# Patient Record
Sex: Male | Born: 1980 | Race: White | Hispanic: No | Marital: Married | Smoking: Never smoker
Health system: Southern US, Community
[De-identification: ages and names within clinical notes are randomized; demographics above are authoritative.]

---

## 2016-01-30 ENCOUNTER — Other Ambulatory Visit: Payer: Self-pay | Admitting: Internal Medicine

## 2016-01-30 DIAGNOSIS — R945 Abnormal results of liver function studies: Secondary | ICD-10-CM

## 2016-02-08 ENCOUNTER — Ambulatory Visit
Admission: RE | Admit: 2016-02-08 | Discharge: 2016-02-08 | Disposition: A | Discharge status: Home / Self Care | Payer: Managed Care, Other (non HMO) | Source: Ambulatory Visit | Attending: Internal Medicine | Admitting: Internal Medicine

## 2016-02-08 ENCOUNTER — Encounter (INDEPENDENT_AMBULATORY_CARE_PROVIDER_SITE_OTHER): Payer: Self-pay

## 2016-02-08 DIAGNOSIS — R945 Abnormal results of liver function studies: Secondary | ICD-10-CM

## 2017-02-18 ENCOUNTER — Other Ambulatory Visit: Payer: Self-pay | Admitting: Internal Medicine

## 2017-02-18 DIAGNOSIS — R7989 Other specified abnormal findings of blood chemistry: Secondary | ICD-10-CM

## 2017-02-18 DIAGNOSIS — R945 Abnormal results of liver function studies: Principal | ICD-10-CM

## 2017-02-20 ENCOUNTER — Other Ambulatory Visit: Payer: Managed Care, Other (non HMO)

## 2017-02-21 ENCOUNTER — Other Ambulatory Visit: Payer: Managed Care, Other (non HMO)

## 2017-11-25 IMAGING — US US ABDOMEN COMPLETE
1 series · 13 of 25 positions shown · non-contrast
Comparison: None in PACs

CLINICAL DATA: Abnormal liver function studies

EXAM:
ABDOMEN ULTRASOUND COMPLETE

[Series 1: us abdomen complete · 0.32mm/px · 13 of 98 slices shown]
[im 1/98]
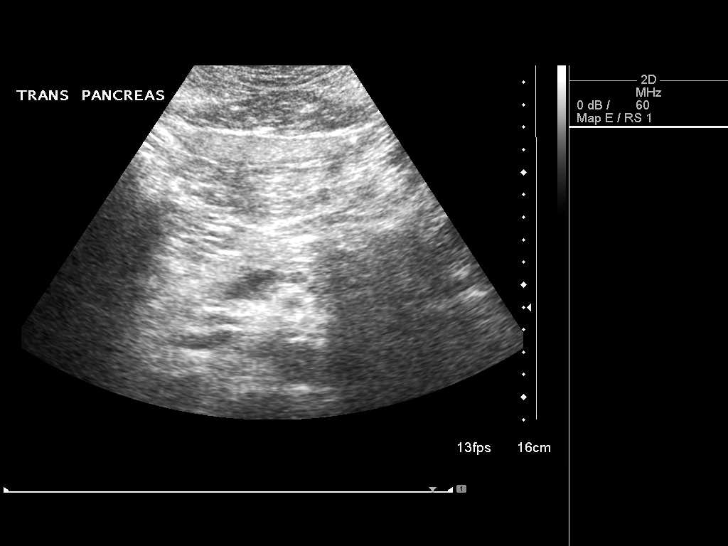
[im 9/98]
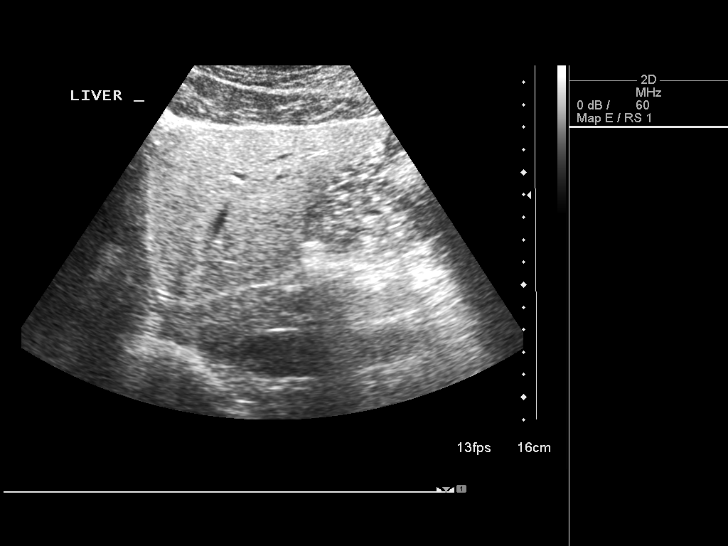
[im 17/98]
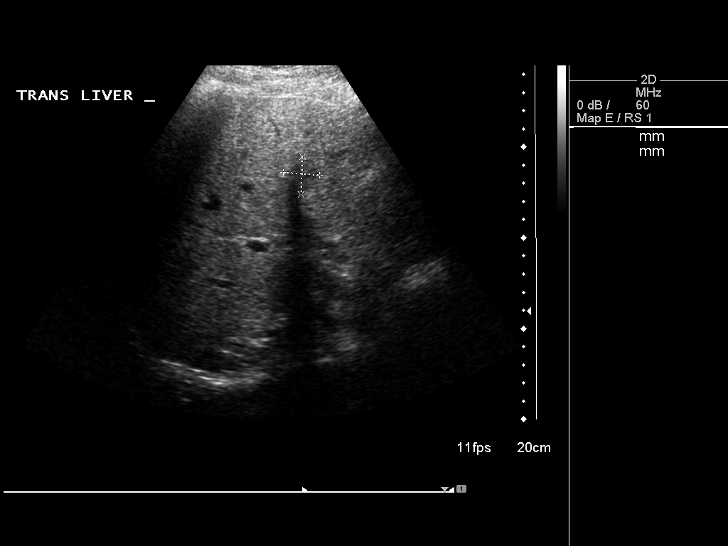
[im 25/98]
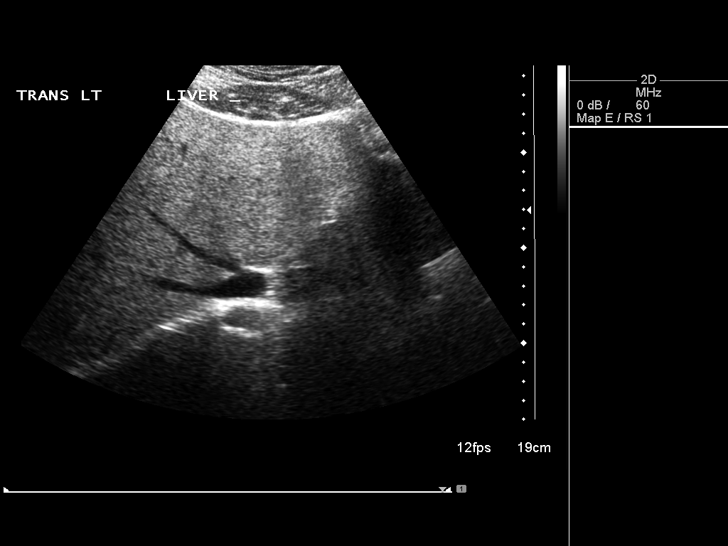
[im 33/98]
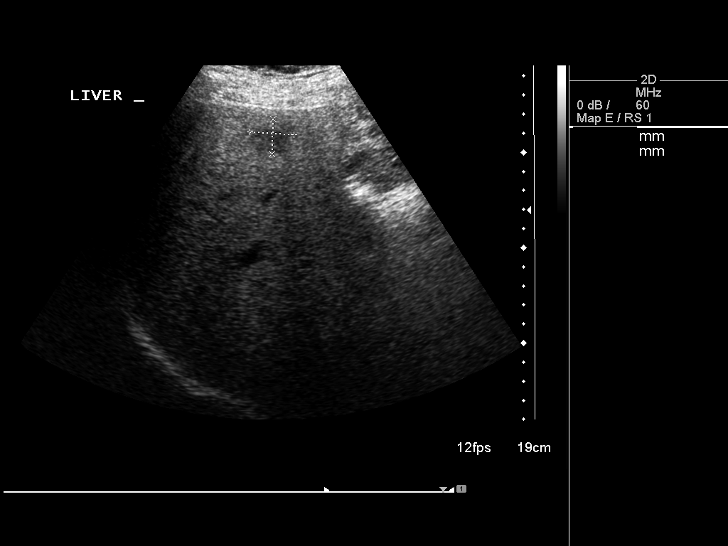
[im 41/98]
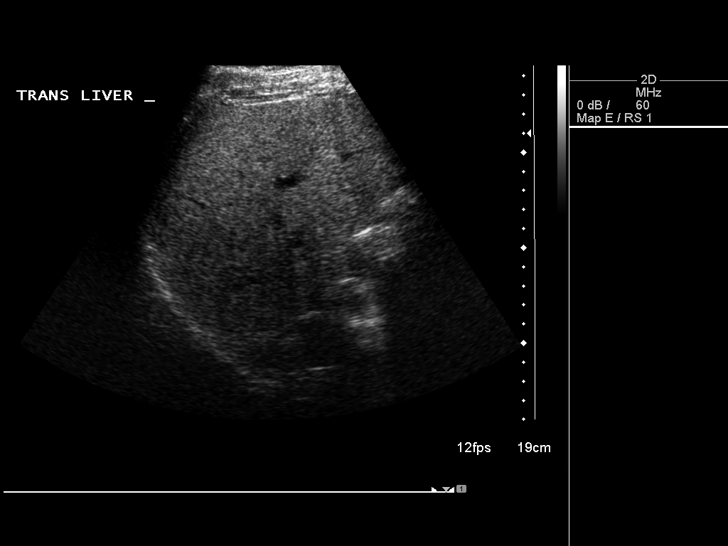
[im 49/98]
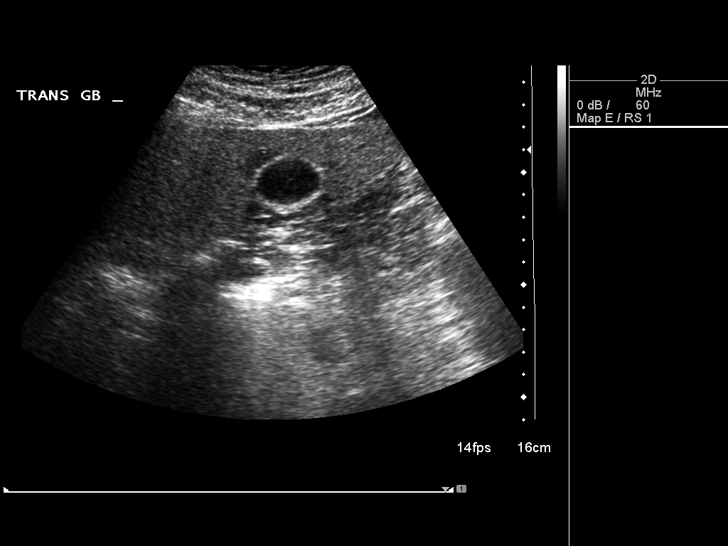
[im 57/98]
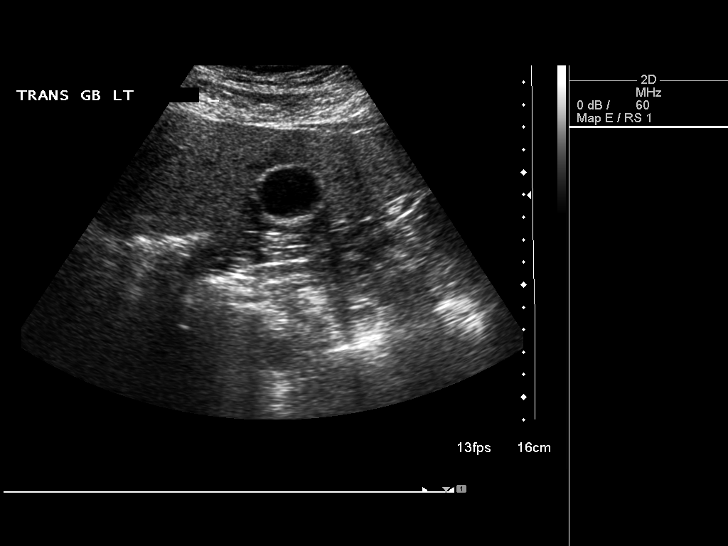
[im 65/98]
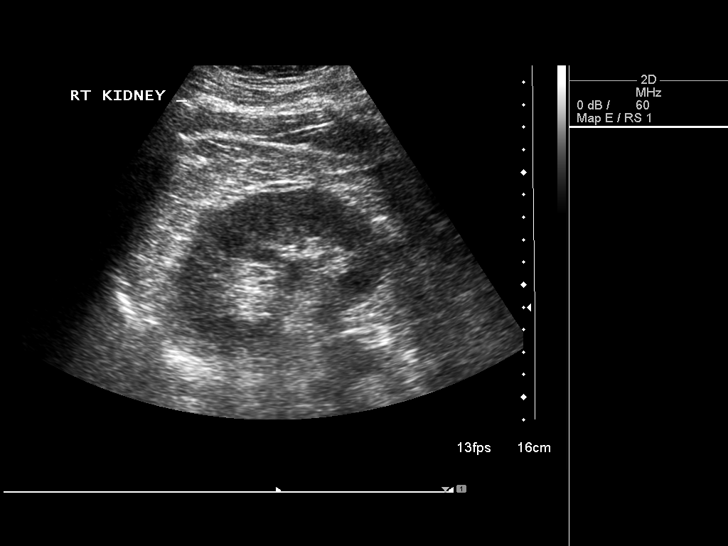
[im 73/98]
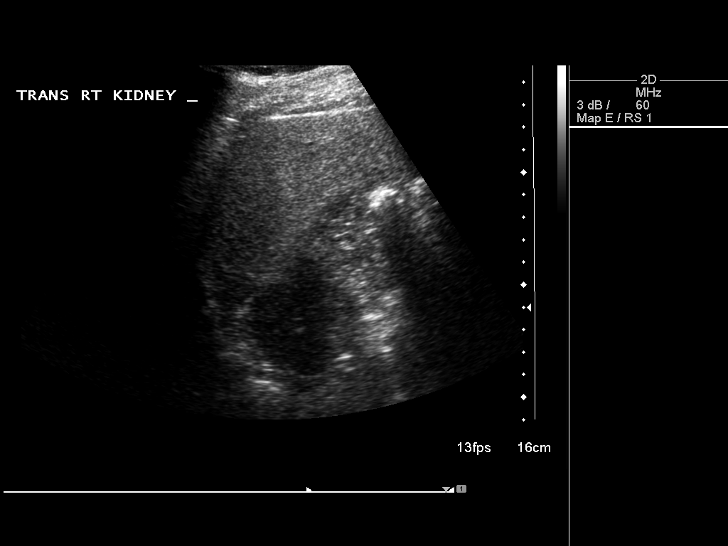
[im 81/98]
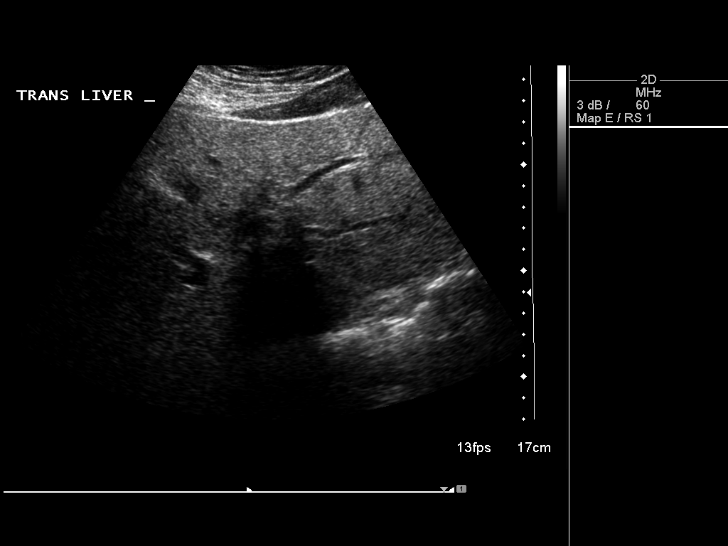
[im 89/98]
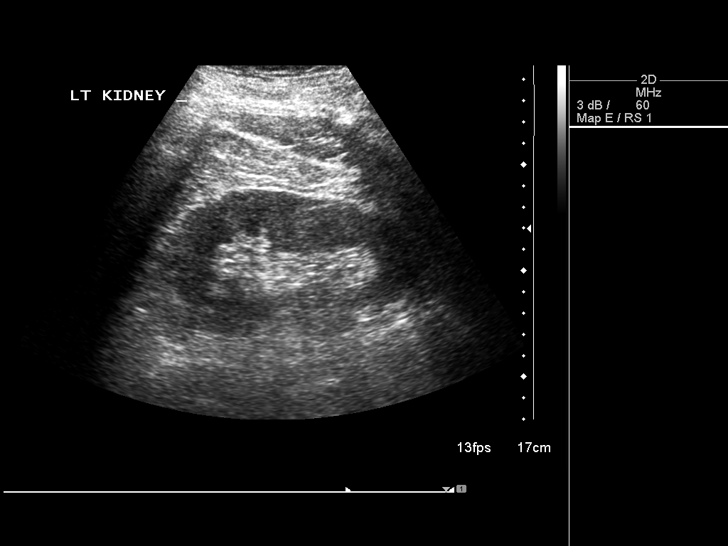
[im 98/98]
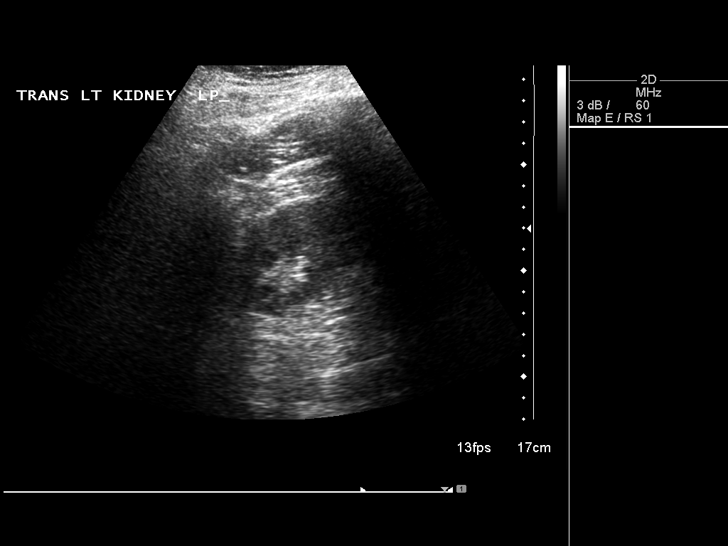

[13 of 25 positions shown; findings below may reference images not displayed]

FINDINGS: Gallbladder: No gallstones or wall thickening visualized. No
sonographic Murphy sign noted by sonographer.

Common bile duct: Diameter: 3.2 mm

Liver: The hepatic echotexture is heterogeneous Bartiromo increased. Areas
of decreased echogenicity are noted near the gallbladder fossa and
adjacent to the portal vein. There is no intrahepatic ductal
dilation. The surface contour of the liver is normal.

IVC: No abnormality visualized.

Pancreas: Bowel gas limits evaluation of the pancreatic head and
tail. The pancreatic body appears normal.

Spleen: Size and appearance within normal limits.

Right Kidney: Length: 10.5 cm. Echogenicity within normal limits. No
mass or hydronephrosis visualized.

Left Kidney: Length: 12.7 cm. Echogenicity within normal limits. No
mass or hydronephrosis visualized.

Abdominal aorta: No aneurysm visualized.

Other findings: There is no ascites.
IMPRESSION: 1. No gallstones or evidence of other acute gallbladder abnormality.
If chronic cholecystitis is suspected clinically, a nuclear medicine
hepatobiliary scan may be useful.
2. Heterogeneously increased hepatic echotexture with several areas
of decreased echogenicity. While this may reflect fatty infiltration
with focal fatty sparing, given the elevated liver function studies,
one cannot exclude other hepatic pathology. A hepatic protocol MRI
is recommended.
3. No acute abnormality is observed elsewhere within the abdomen.

## 2019-08-13 ENCOUNTER — Other Ambulatory Visit: Payer: Self-pay

## 2019-08-13 DIAGNOSIS — Z20822 Contact with and (suspected) exposure to covid-19: Secondary | ICD-10-CM

## 2019-08-16 LAB — NOVEL CORONAVIRUS, NAA: SARS-CoV-2, NAA: NOT DETECTED

## 2019-12-16 ENCOUNTER — Ambulatory Visit: Payer: Managed Care, Other (non HMO) | Attending: Internal Medicine

## 2019-12-16 DIAGNOSIS — Z23 Encounter for immunization: Secondary | ICD-10-CM

## 2019-12-16 NOTE — Progress Notes (Signed)
   Covid-19 Vaccination Clinic  Name:  Christopher Dillon    MRN: [id number] DOB: [birth year]  12/16/2019  [name] was observed post Covid-19 immunization for 15 minutes without incident. He was provided with Vaccine Information Sheet and instruction to access the V-Safe system.   [name] was instructed to call 911 with any severe reactions post vaccine: Marland Kitchen Difficulty breathing  . Swelling of face and throat  . A fast heartbeat  . A bad rash all over body  . Dizziness and weakness   Immunizations Administered    Name Date Dose VIS Date Route   Pfizer COVID-19 Vaccine 12/16/2019  3:45 PM 0.3 mL 09/03/2019 Intramuscular   Manufacturer: [employer], [employer]   Lot: [id number]   NDC: [address]-3

## 2020-01-10 ENCOUNTER — Ambulatory Visit: Payer: Managed Care, Other (non HMO) | Attending: Internal Medicine

## 2020-01-11 ENCOUNTER — Ambulatory Visit: Payer: Managed Care, Other (non HMO) | Attending: Internal Medicine

## 2020-01-11 DIAGNOSIS — Z23 Encounter for immunization: Secondary | ICD-10-CM

## 2020-01-11 NOTE — Progress Notes (Signed)
   Covid-19 Vaccination Clinic  Name:  Christopher Dillon    MRN: [id number] DOB: June 14, 1981  01/11/2020  Christopher Dillon was observed post Covid-19 immunization for 15 minutes without incident. He was provided with Vaccine Information Sheet and instruction to access the V-Safe system.   Christopher Dillon was instructed to call 911 with any severe reactions post vaccine: Marland Kitchen Difficulty breathing  . Swelling of face and throat  . A fast heartbeat  . A bad rash all over body  . Dizziness and weakness   Immunizations Administered    Name Date Dose VIS Date Route   Pfizer COVID-19 Vaccine 01/11/2020 12:19 PM 0.3 mL 11/17/2018 Intramuscular   Manufacturer: [employer], [employer]   Lot: [id number]   NDC: [address]-7

## 2022-04-26 ENCOUNTER — Ambulatory Visit
Admission: EM | Admit: 2022-04-26 | Discharge: 2022-04-26 | Disposition: A | Discharge status: Home / Self Care | Payer: Commercial Managed Care - PPO | Attending: Urgent Care | Admitting: Urgent Care

## 2022-04-26 ENCOUNTER — Encounter: Payer: Self-pay | Admitting: Emergency Medicine

## 2022-04-26 DIAGNOSIS — F17200 Nicotine dependence, unspecified, uncomplicated: Secondary | ICD-10-CM | POA: Diagnosis not present

## 2022-04-26 DIAGNOSIS — H65192 Other acute nonsuppurative otitis media, left ear: Secondary | ICD-10-CM

## 2022-04-26 DIAGNOSIS — R0981 Nasal congestion: Secondary | ICD-10-CM | POA: Diagnosis not present

## 2022-04-26 DIAGNOSIS — R0789 Other chest pain: Secondary | ICD-10-CM

## 2022-04-26 MED ORDER — CETIRIZINE HCL 10 MG PO TABS: 10.0000 mg | ORAL_TABLET | Freq: Every day | ORAL | 0 refills | Status: AC

## 2022-04-26 MED ORDER — PSEUDOEPHEDRINE HCL 60 MG PO TABS: 60.0000 mg | ORAL_TABLET | Freq: Three times a day (TID) | ORAL | 0 refills | Status: AC | PRN

## 2022-04-26 MED ORDER — AMOXICILLIN-POT CLAVULANATE 875-125 MG PO TABS: 1.0000 | ORAL_TABLET | Freq: Two times a day (BID) | ORAL | 0 refills | Status: AC

## 2022-04-26 NOTE — ED Triage Notes (Signed)
Pt here with left ear pain and fullness and congestion x 2 days.

## 2022-04-26 NOTE — ED Provider Notes (Signed)
[facility]   MRN: [id number] DOB: Apr 17, 1981  Subjective:   Christopher Dillon is a 41 y.o. male presenting for 2-day history of acute onset left ear pain, left ear fullness, sinus congestion and drainage.  Has also had chest tightness, right-sided chest pain.  Does a lot of heavy lifting for his work.  He is also a smoker.  No history of respiratory disorders.  No current facility-administered medications for this encounter. No current outpatient medications on file.   No Known Allergies  History reviewed. No pertinent past medical history.   History reviewed. No pertinent surgical history.  History reviewed. No pertinent family history.  Social History   Tobacco Use   Smoking status: Never   Smokeless tobacco: Never  Substance Use Topics   Alcohol use: Yes    Comment: occ   Drug use: Never    ROS   Objective:   Vitals: BP 131/73   Pulse 88   Temp 98.3 F (36.8 C)   Resp 20   SpO2 98%   Physical Exam Constitutional:      General: He is not in acute distress.    Appearance: Normal appearance. He is well-developed and normal weight. He is not ill-appearing, toxic-appearing or diaphoretic.  HENT:     Head: Normocephalic and atraumatic.     Right Ear: Tympanic membrane, ear canal and external ear normal. There is no impacted cerumen. Tympanic membrane is not erythematous or bulging.     Left Ear: Ear canal and external ear normal. There is no impacted cerumen. Tympanic membrane is erythematous and bulging.     Nose: Nose normal. No congestion or rhinorrhea.     Mouth/Throat:     Mouth: Mucous membranes are moist.     Pharynx: No oropharyngeal exudate or posterior oropharyngeal erythema.  Eyes:     General: No scleral icterus.       Right eye: No discharge.        Left eye: No discharge.     Extraocular Movements: Extraocular movements intact.     Conjunctiva/sclera: Conjunctivae normal.  Cardiovascular:     Rate and Rhythm: Normal  rate and regular rhythm.     Heart sounds: Normal heart sounds. No murmur heard.    No friction rub. No gallop.  Pulmonary:     Effort: Pulmonary effort is normal. No respiratory distress.     Breath sounds: Normal breath sounds. No stridor. No wheezing, rhonchi or rales.  Musculoskeletal:     Cervical back: Normal range of motion and neck supple. No rigidity. No muscular tenderness.  Neurological:     General: No focal deficit present.     Mental Status: He is alert and oriented to person, place, and time.  Psychiatric:        Mood and Affect: Mood normal.        Behavior: Behavior normal.        Thought Content: Thought content normal.        Judgment: Judgment normal.     Assessment and Plan :   PDMP not reviewed this encounter.  1. Other non-recurrent acute nonsuppurative otitis media of left ear   2. Sinus congestion   3. Atypical chest pain   4. Smoker    Start Augmentin to cover for otitis media. Use supportive care otherwise. Deferred imaging given clear cardiopulmonary exam, hemodynamically stable vital signs.  Counseled patient on potential for adverse effects with medications prescribed/recommended today, ER and return-to-clinic precautions discussed,  patient verbalized understanding.    Wallis Bamberg, [place] 04/26/22 [phone]

## 2024-01-09 ENCOUNTER — Other Ambulatory Visit: Payer: Self-pay | Admitting: Medical Genetics

## 2024-07-14 ENCOUNTER — Other Ambulatory Visit: Payer: Self-pay | Admitting: Medical Genetics

## 2024-07-14 DIAGNOSIS — Z006 Encounter for examination for normal comparison and control in clinical research program: Secondary | ICD-10-CM
